# Patient Record
Sex: Female | Born: 2000
Health system: Southern US, Community
[De-identification: ages and names within clinical notes are randomized; demographics above are authoritative.]

## PROBLEM LIST (undated history)

## (undated) ENCOUNTER — Ambulatory Visit: Discharge status: Absent without leave | Payer: Self-pay

## (undated) DIAGNOSIS — N390 Urinary tract infection, site not specified: Secondary | ICD-10-CM

## (undated) DIAGNOSIS — Z975 Presence of (intrauterine) contraceptive device: Secondary | ICD-10-CM

## (undated) DIAGNOSIS — A549 Gonococcal infection, unspecified: Secondary | ICD-10-CM

## (undated) HISTORY — DX: Urinary tract infection, site not specified: N39.0

## (undated) HISTORY — DX: Gonococcal infection, unspecified: A54.9

## (undated) HISTORY — DX: Presence of (intrauterine) contraceptive device: Z97.5

---
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
## 2019-05-07 ENCOUNTER — Other Ambulatory Visit (HOSPITAL_COMMUNITY)
Admission: RE | Admit: 2019-05-07 | Discharge: 2019-05-07 | Disposition: A | Discharge status: Home / Self Care | Payer: No Typology Code available for payment source | Source: Ambulatory Visit | Attending: Family Medicine | Admitting: Family Medicine

## 2019-05-07 ENCOUNTER — Other Ambulatory Visit: Payer: Self-pay

## 2019-05-07 ENCOUNTER — Encounter: Payer: Self-pay | Admitting: Family Medicine

## 2019-05-07 ENCOUNTER — Ambulatory Visit (INDEPENDENT_AMBULATORY_CARE_PROVIDER_SITE_OTHER): Payer: No Typology Code available for payment source | Admitting: Family Medicine

## 2019-05-07 VITALS — BP 112/68 | HR 78 | Temp 99.1°F | Resp 15 | Ht 67.0 in | Wt 172.0 lb

## 2019-05-07 DIAGNOSIS — R5383 Other fatigue: Secondary | ICD-10-CM | POA: Diagnosis not present

## 2019-05-07 DIAGNOSIS — Z23 Encounter for immunization: Secondary | ICD-10-CM

## 2019-05-07 DIAGNOSIS — N3001 Acute cystitis with hematuria: Secondary | ICD-10-CM

## 2019-05-07 DIAGNOSIS — Z833 Family history of diabetes mellitus: Secondary | ICD-10-CM

## 2019-05-07 DIAGNOSIS — R300 Dysuria: Secondary | ICD-10-CM | POA: Insufficient documentation

## 2019-05-07 LAB — POCT URINALYSIS DIP (CLINITEK)
Bilirubin, UA: NEGATIVE
Glucose, UA: NEGATIVE mg/dL
Ketones, POC UA: NEGATIVE mg/dL
Nitrite, UA: NEGATIVE
POC PROTEIN,UA: NEGATIVE
Spec Grav, UA: 1.025 (ref 1.010–1.025)
Urobilinogen, UA: 0.2 E.U./dL
pH, UA: 7 (ref 5.0–8.0)

## 2019-05-07 MED ORDER — NITROFURANTOIN MONOHYD MACRO 100 MG PO CAPS: 100.0000 mg | ORAL_CAPSULE | Freq: Two times a day (BID) | ORAL | 0 refills | Status: AC

## 2019-05-07 NOTE — Progress Notes (Signed)
Subjective:  Patient ID: Michelle Hunter, female    DOB: 10-30-2000  Age: 18 y.o. MRN: [id number]  CC:  Chief Complaint  Patient presents with  . New Patient (Initial Visit)    establish care  . burning with urination    x 1 week  . foul odor to urine     HPI  HPI Michelle Hunter is a 18 year old female patient who is here to establish care.  Previously seen at her pediatrician's office.  Reports that she has had some excessive feelings of tiredness thought it was because she started working third shift several months ago at [employer] but reports that she is not feeling any better.  Mother is noticing that she just seems to be very lethargic when even if she slept.  Was concern for possible low iron as she has had anemia when she was younger and her mother also had anemia.  Has family history that includes diabetes .  Would like to get flu shot today.  Reports that she feels like she may have a urinary tract infection also.  A week ago she noticed that she was having some increased sensation of urination need and frequency along with burning with voiding.  Denies having any risk of sexually transmitted diseases but would like to be tested for that to rule out in case is not an infection.  Otherwise doing well and has limited history given age.  Not currently taking any medications at this time.  Denies wanting any birth control currently.  Today patient denies signs and symptoms of COVID 19 infection including fever, chills, cough, shortness of breath, and headache. Past Medical, Surgical, Social History, Allergies, and Medications have been Reviewed.   Past Medical History:  Diagnosis Date  . Gonorrhea 2015  . Nexplanon in place   . Urinary tract infection     No outpatient medications have been marked as taking for the 05/07/19 encounter (Office Visit) with Freddy Finner, NP.    ROS:  Review of Systems  Constitutional: Negative.   HENT: Negative.   Eyes:  Negative.   Respiratory: Negative.   Cardiovascular: Negative.   Gastrointestinal: Negative.   Genitourinary: Positive for dysuria.  Musculoskeletal: Negative.   Skin: Negative.   Neurological: Negative.   Endo/Heme/Allergies: Negative.   Psychiatric/Behavioral: Negative.      Objective:   Today's Vitals: BP 112/68   Pulse 78   Temp 99.1 F (37.3 C) (Oral)   Resp 15   Ht 5\' 7"  (1.702 m)   Wt 172 lb 0.6 oz (78 kg)   SpO2 94%   BMI 26.95 kg/m  Vitals with BMI 05/07/2019 06/05/2018 06/19/2017  Height 5\' 7"  5\' 9"  5\' 7"   Weight 172 lbs 1 oz 133 lbs 6 oz 133 lbs  BMI 26.94 19.69 20.83  Systolic 112 105 90  Diastolic 68 70 64  Pulse 78 98 82     Physical Exam Vitals and nursing note reviewed.  Constitutional:      Appearance: Normal appearance. She is well-developed, well-groomed and overweight.  HENT:     Head: Normocephalic and atraumatic.     Right Ear: External ear normal.     Left Ear: External ear normal.     Nose: Nose normal.     Mouth/Throat:     Mouth: Mucous membranes are moist.     Pharynx: Oropharynx is clear.  Eyes:     General:        Right eye: No  discharge.        Left eye: No discharge.     Conjunctiva/sclera: Conjunctivae normal.  Cardiovascular:     Rate and Rhythm: Normal rate and regular rhythm.     Pulses: Normal pulses.     Heart sounds: Normal heart sounds.  Pulmonary:     Effort: Pulmonary effort is normal.     Breath sounds: Normal breath sounds.  Musculoskeletal:        General: Normal range of motion.     Cervical back: Normal range of motion and neck supple.  Skin:    General: Skin is warm.  Neurological:     General: No focal deficit present.     Mental Status: She is alert and oriented to person, place, and time.  Psychiatric:        Attention and Perception: Attention normal.        Mood and Affect: Mood normal.        Speech: Speech normal.        Behavior: Behavior normal. Behavior is cooperative.        Thought Content:  Thought content normal.        Cognition and Memory: Cognition normal.        Judgment: Judgment normal.    Assessment   1. Acute cystitis with hematuria   2. Fatigue, unspecified type   3. Family history of diabetes mellitus   4. Need for immunization against influenza     Tests ordered Orders Placed This Encounter  Procedures  . Urine Culture  . Flu Vaccine QUAD 36+ mos IM  . CBC with Differential/Platelet  . BASIC METABOLIC PANEL WITH GFR  . Hemoglobin A1c  . TSH  . VITAMIN D 25 Hydroxy  . POCT URINALYSIS DIP (CLINITEK)    Plan: Please see assessment and plan per problem list above.   Meds ordered this encounter  Medications  . nitrofurantoin, macrocrystal-monohydrate, (MACROBID) 100 MG capsule    Sig: Take 1 capsule (100 mg total) by mouth 2 (two) times daily for 5 days.    Dispense:  10 capsule    Refill:  0    Order Specific Question:   Supervising Provider    Answer:   Fayrene Helper [[id number]]    Patient to follow-up in 6 months   Perlie Mayo, NP

## 2019-05-07 NOTE — Patient Instructions (Addendum)
  I appreciate the opportunity to provide you with care for your health and wellness. Today we discussed: overall health   Follow up: 6 months for annual no pap  Labs today.  Nice to meet you today :)  Please start taking Macrobid as directed on the bottle. Drink water with each dose.  We will update you on labs once we have them back.  I hope you have a wonderful, happy, safe, and healthy Holiday Season! See you in the New Year :)  Please continue to practice social distancing to keep you, your family, and our community safe.  If you must go out, please wear a mask and practice good handwashing.  It was a pleasure to see you and I look forward to continuing to work together on your health and well-being. Please do not hesitate to call the office if you need care or have questions about your care.  Have a wonderful day and week. With Gratitude, Cherly Beach, DNP, AGNP-BC

## 2019-05-08 ENCOUNTER — Other Ambulatory Visit (HOSPITAL_COMMUNITY)
Admission: RE | Admit: 2019-05-08 | Discharge: 2019-05-08 | Disposition: A | Discharge status: Home / Self Care | Payer: Self-pay | Source: Ambulatory Visit | Attending: Family Medicine | Admitting: Family Medicine

## 2019-05-08 ENCOUNTER — Other Ambulatory Visit: Payer: Self-pay | Admitting: Family Medicine

## 2019-05-08 DIAGNOSIS — E559 Vitamin D deficiency, unspecified: Secondary | ICD-10-CM

## 2019-05-08 DIAGNOSIS — R8290 Unspecified abnormal findings in urine: Secondary | ICD-10-CM | POA: Insufficient documentation

## 2019-05-08 DIAGNOSIS — R300 Dysuria: Secondary | ICD-10-CM | POA: Insufficient documentation

## 2019-05-08 LAB — BASIC METABOLIC PANEL WITH GFR
BUN: 11 mg/dL (ref 7–20)
CO2: 25 mmol/L (ref 20–32)
Calcium: 9 mg/dL (ref 8.9–10.4)
Chloride: 106 mmol/L (ref 98–110)
Creat: 0.78 mg/dL (ref 0.50–1.00)
GFR, Est African American: 128 mL/min/{1.73_m2} (ref >=60)
GFR, Est Non African American: 110 mL/min/{1.73_m2} (ref >=60)
Glucose, Bld: 88 mg/dL (ref 65–139)
Potassium: 4 mmol/L (ref 3.8–5.1)
Sodium: 139 mmol/L (ref 135–146)

## 2019-05-08 LAB — CBC WITH DIFFERENTIAL/PLATELET
Absolute Monocytes: 600 cells/uL (ref 200–950)
Basophils Absolute: 28 cells/uL (ref 0–200)
Basophils Relative: 0.5 %
Eosinophils Absolute: 61 cells/uL (ref 15–500)
Eosinophils Relative: 1.1 %
HCT: 37.9 % (ref 35.0–45.0)
Hemoglobin: 12.5 g/dL (ref 11.7–15.5)
Lymphs Abs: 2140 cells/uL (ref 850–3900)
MCH: 27.7 pg (ref 27.0–33.0)
MCHC: 33 g/dL (ref 32.0–36.0)
MCV: 84 fL (ref 80.0–100.0)
MPV: 11.4 fL (ref 7.5–12.5)
Monocytes Relative: 10.9 %
Neutro Abs: 2673 cells/uL (ref 1500–7800)
Neutrophils Relative %: 48.6 %
Platelets: 208 10*3/uL (ref 140–400)
RBC: 4.51 10*6/uL (ref 3.80–5.10)
RDW: 12.4 % (ref 11.0–15.0)
Total Lymphocyte: 38.9 %
WBC: 5.5 10*3/uL (ref 3.8–10.8)

## 2019-05-08 LAB — HEMOGLOBIN A1C
Hgb A1c MFr Bld: 5.5 % of total Hgb (ref <5.7)
Mean Plasma Glucose: 111 (calc)
eAG (mmol/L): 6.2 (calc)

## 2019-05-08 LAB — TSH: TSH: 1.59 mIU/L

## 2019-05-08 LAB — VITAMIN D 25 HYDROXY (VIT D DEFICIENCY, FRACTURES): Vit D, 25-Hydroxy: 9 ng/mL — ABNORMAL LOW (ref 30–100)

## 2019-05-08 MED ORDER — VITAMIN D (ERGOCALCIFEROL) 1.25 MG (50000 UNIT) PO CAPS: 50000.0000 [IU] | ORAL_CAPSULE | ORAL | 1 refills | Status: DC

## 2019-05-10 LAB — URINE CULTURE: Culture: >=100000 — AB

## 2019-05-11 ENCOUNTER — Encounter: Payer: Self-pay | Admitting: Family Medicine

## 2019-05-11 DIAGNOSIS — Z23 Encounter for immunization: Secondary | ICD-10-CM

## 2019-05-11 DIAGNOSIS — Z833 Family history of diabetes mellitus: Secondary | ICD-10-CM | POA: Insufficient documentation

## 2019-05-11 DIAGNOSIS — R5383 Other fatigue: Secondary | ICD-10-CM | POA: Insufficient documentation

## 2019-05-11 HISTORY — DX: Encounter for immunization: Z23

## 2019-05-11 NOTE — Assessment & Plan Note (Signed)
Family Hx-given excessive fatigue- will check A1c

## 2019-05-11 NOTE — Assessment & Plan Note (Signed)
Checking labs ans S&S of vitamin def. Vs night shift sleep trouble.

## 2019-05-11 NOTE — Assessment & Plan Note (Signed)
+   dip in office, sent for cx tx with macrobid Encouraged safe sex practices.

## 2019-05-11 NOTE — Assessment & Plan Note (Signed)

## 2019-05-21 LAB — URINE CYTOLOGY ANCILLARY ONLY
Bacterial Vaginitis-Urine: POSITIVE — AB
Candida Urine: NEGATIVE
Chlamydia: NEGATIVE
Comment: NEGATIVE
Comment: NORMAL
Neisseria Gonorrhea: NEGATIVE

## 2019-06-02 ENCOUNTER — Other Ambulatory Visit: Payer: Self-pay | Admitting: Family Medicine

## 2019-06-02 DIAGNOSIS — B9689 Other specified bacterial agents as the cause of diseases classified elsewhere: Secondary | ICD-10-CM

## 2019-06-02 MED ORDER — METRONIDAZOLE 500 MG PO TABS: 500.0000 mg | ORAL_TABLET | Freq: Two times a day (BID) | ORAL | 0 refills | Status: AC

## 2019-11-06 ENCOUNTER — Ambulatory Visit (INDEPENDENT_AMBULATORY_CARE_PROVIDER_SITE_OTHER): Payer: Self-pay | Admitting: Family Medicine

## 2019-11-06 ENCOUNTER — Other Ambulatory Visit: Payer: Self-pay

## 2019-11-06 ENCOUNTER — Encounter: Payer: Self-pay | Admitting: Family Medicine

## 2019-11-06 VITALS — BP 118/72 | HR 76 | Temp 97.6°F | Ht 68.0 in | Wt 171.8 lb

## 2019-11-06 DIAGNOSIS — Z3200 Encounter for pregnancy test, result unknown: Secondary | ICD-10-CM

## 2019-11-06 DIAGNOSIS — Z6826 Body mass index (BMI) 26.0-26.9, adult: Secondary | ICD-10-CM

## 2019-11-06 DIAGNOSIS — E663 Overweight: Secondary | ICD-10-CM

## 2019-11-06 DIAGNOSIS — Z0001 Encounter for general adult medical examination with abnormal findings: Secondary | ICD-10-CM | POA: Insufficient documentation

## 2019-11-06 LAB — POCT URINE PREGNANCY: Preg Test, Ur: NEGATIVE

## 2019-11-06 NOTE — Progress Notes (Signed)
  Health Maintenance reviewed -   Immunization History  Administered Date(s) Administered  . DTaP 09/10/1999, 12/22/1999, 02/22/2001, 07/30/2001  . HPV 9-valent 02/02/2015  . HPV Quadrivalent 06/14/2013, 09/15/2014  . Hepatitis A 04/04/2014  . Hepatitis A, Ped/Adol-2 Dose 06/14/2013  . Hepatitis B 06/29/1999, 07/28/1999, 12/22/1999  . HiB (PRP-OMP) 09/10/1999, 12/22/1999, 02/22/2001, 07/30/2001  . IPV 09/10/1999, 12/22/1999, 02/22/2001  . Influenza Nasal 03/04/2008, 04/17/2008  . Influenza Whole 04/12/2010  . Influenza,inj,Quad PF,6+ Mos 05/07/2019  . Influenza-Unspecified 04/04/2014  . MMR 02/22/2001, 12/01/2014  . Meningococcal Conjugate 06/14/2013  . Pneumococcal Conjugate-13 09/10/1999, 09/10/1999, 12/22/1999, 02/22/2001  . Td 04/12/2010  . Tdap 04/12/2010  . Varicella 02/22/2001, 06/14/2013   Last Pap smear: N/A Last mammogram: N/A Last colonoscopy: N/A Last DEXA: N/A Dentist: Sees twice yearly last month Ophtho: Wears glasses but does not wear them all the time updated Exercise: Is not exercising Smoker: Not smoking Alcohol Use: Not using alcohol  Other doctors caring for patient include:  Patient Care Team: Mills, Hannah M, NP as PCP - General (Family Medicine)  End of Life Discussion:  Patient does not have a living will and medical power of attorney  Subjective:   HPI  Michelle Hunter is a 19 y.o. female who presents for annual wellness visit and follow-up on chronic medical conditions.  She has the following concerns: Recently had unprotected sex.  Last cycle was June 9-13.  Is wondering if she is pregnant.  However her next cycle is not due for another week and a half.  She has not had any irregular bleeding, cramps or signs of possible implantation.  She denies having any other issues or concerns today.  Review Of Systems  Review of Systems  Constitutional: Negative.   HENT: Negative.   Eyes: Negative.   Respiratory: Negative.   Cardiovascular:  Negative.   Gastrointestinal: Negative.   Endocrine: Negative.   Genitourinary: Negative.   Musculoskeletal: Negative.   Skin: Negative.   Allergic/Immunologic: Negative.   Neurological: Negative.   Hematological: Negative.   Psychiatric/Behavioral: Negative.   All other systems reviewed and are negative.   Objective:   PHYSICAL EXAM:  BP 118/72 (BP Location: Left Arm, Patient Position: Sitting, Cuff Size: Normal)   Pulse 76   Temp 97.6 F (36.4 C) (Temporal)   Ht 5' 8" (1.727 m)   Wt 171 lb 12.8 oz (77.9 kg)   LMP 10/23/2019 (Exact Date)   SpO2 99%   BMI 26.12 kg/m    Physical Exam Vitals and nursing note reviewed.  Constitutional:      Appearance: Normal appearance. She is well-developed, well-groomed and overweight.  HENT:     Head: Normocephalic.     Right Ear: Hearing, tympanic membrane, ear canal and external ear normal.     Left Ear: Hearing, tympanic membrane, ear canal and external ear normal.     Nose: Nose normal.     Mouth/Throat:     Lips: Pink.     Mouth: Mucous membranes are moist.     Pharynx: Oropharynx is clear. Uvula midline.  Eyes:     General: Lids are normal.     Extraocular Movements: Extraocular movements intact.     Conjunctiva/sclera: Conjunctivae normal.     Pupils: Pupils are equal, round, and reactive to light.  Neck:     Thyroid: No thyroid mass, thyromegaly or thyroid tenderness.  Cardiovascular:     Rate and Rhythm: Normal rate and regular rhythm.     Pulses: Normal pulses.            Radial pulses are 2+ on the right side and 2+ on the left side.       Dorsalis pedis pulses are 2+ on the right side and 2+ on the left side.     Heart sounds: Normal heart sounds.  Pulmonary:     Effort: Pulmonary effort is normal.     Breath sounds: Normal breath sounds and air entry.  Abdominal:     General: Abdomen is flat. Bowel sounds are normal.     Palpations: Abdomen is soft.     Tenderness: There is no abdominal tenderness. There is no  right CVA tenderness or left CVA tenderness.     Hernia: No hernia is present.  Musculoskeletal:        General: Normal range of motion.     Cervical back: Full passive range of motion without pain, normal range of motion and neck supple.     Right lower leg: No edema.     Left lower leg: No edema.     Comments: Moves all extremities, full range of motion intact throughout  Lymphadenopathy:     Cervical: No cervical adenopathy.  Skin:    General: Skin is warm and dry.     Capillary Refill: Capillary refill takes less than 2 seconds.  Neurological:     General: No focal deficit present.     Mental Status: She is alert and oriented to person, place, and time. Mental status is at baseline.     Cranial Nerves: Cranial nerves are intact.     Sensory: Sensation is intact.     Motor: Motor function is intact.     Coordination: Coordination is intact.     Gait: Gait is intact.     Deep Tendon Reflexes: Reflexes are normal and symmetric.  Psychiatric:        Attention and Perception: Attention and perception normal.        Mood and Affect: Mood and affect normal.        Speech: Speech normal.        Behavior: Behavior normal. Behavior is cooperative.        Thought Content: Thought content normal.        Cognition and Memory: Cognition and memory normal.        Judgment: Judgment normal.     Comments: Good communication, good eye contact, pleasant throughout conversation      Depression Screening  Depression screen [facility] 2/9 11/06/2019 05/07/2019 03/09/2017 06/24/2016 03/15/2016  Decreased Interest 0 0 0 0 0  Down, Depressed, Hopeless 0 0 0 0 0  PHQ - 2 Score 0 0 0 0 0  Altered sleeping 0 - 0 0 0  Tired, decreased energy 0 - 0 0 0  Change in appetite 0 - 0 1 0  Feeling bad or failure about yourself  0 - 0 0 0  Trouble concentrating 0 - 0 0 0  Moving slowly or fidgety/restless 0 - 0 0 0  Suicidal thoughts 0 - 0 0 0  PHQ-9 Score 0 - 0 1 0  Difficult doing work/chores Not difficult at  all - - - -     Falls  Fall Risk  11/06/2019 05/07/2019  Falls in the past year? 0 0  Number falls in past yr: 0 0  Injury with Fall? 0 0  Risk for fall due to : No Fall Risks -  Follow up Falls evaluation completed -    Assessment & Plan:   1. Annual visit  for general adult medical examination with abnormal findings   2. Overweight with body mass index (BMI) of 26 to 26.9 in adult   3. Possible pregnancy     Tests ordered Orders Placed This Encounter  Procedures  . POCT urine pregnancy     Plan: Please see assessment and plan per problem list above.   No orders of the defined types were placed in this encounter.   I have personally reviewed: The patient's medical and social history Their use of alcohol, tobacco or illicit drugs Their current medications and supplements The patient's functional ability including ADLs,fall risks, home safety risks, cognitive, and hearing and visual impairment Diet and physical activities Evidence for depression or mood disorders  The patient's weight, height, BMI, and visual acuity have been recorded in the chart.  I have made referrals, counseling, and provided education to the patient based on review of the above and I have provided the patient with a written personalized care plan for preventive services.    Note: This dictation was prepared with Dragon dictation along with smaller phrase technology. Similar sounding words can be transcribed inadequately or may not be corrected upon review. Any transcriptional errors that result from this process are unintentional.      Hannah M Mills, NP   11/06/2019  

## 2019-11-06 NOTE — Assessment & Plan Note (Signed)
Michelle Hunter is re-educated about the importance of exercise daily to help with weight management. A minumum of 30 minutes daily is recommended. Additionally, importance of healthy food choices.  Wt Readings from Last 3 Encounters:  11/06/19 171 lb 12.8 oz (77.9 kg)  05/07/19 172 lb 0.6 oz (78 kg) (92 %, Z= 1.43)*  06/05/18 133 lb 6.4 oz (60.5 kg) (63 %, Z= 0.33)*   * Growth percentiles are based on CDC (Girls, 2-20 Years) data.

## 2019-11-06 NOTE — Patient Instructions (Signed)
I appreciate the opportunity to provide you with care for your health and wellness. Today we discussed: overall health   Follow up: 1 year for annual no pap  No labs or referrals today  I recommend you starting a prenatal vitamin to take daily.  HEALTH MAINTENANCE RECOMMENDATIONS:  It is recommended that you get at least 30 minutes of aerobic exercise at least 5 days/week (for weight loss, you may need as much as 60-90 minutes). This can be any activity that gets your heart rate up. This can be divided in 10-15 minute intervals if needed, but try and build up your endurance at least once a week.  Weight bearing exercise is also recommended twice weekly.  Eat a healthy diet with lots of vegetables, fruits and fiber.  "Colorful" foods have a lot of vitamins (ie green vegetables, tomatoes, red peppers, etc).  Limit sweet tea, regular sodas and alcoholic beverages, all of which has a lot of calories and sugar.  Up to 1 alcoholic drink daily may be beneficial for women (unless trying to lose weight, watch sugars).  Drink a lot of water.  Monthly self breast exams and yearly mammograms for women over the age of 77 is recommended.  Sunscreen of at least SPF 30 should be used on all sun-exposed parts of the skin when outside between the hours of 10 am and 4 pm (not just when at beach or pool, but even with exercise, golf, tennis, and yard work!)  Use a sunscreen that says "broad spectrum" so it covers both UVA and UVB rays, and make sure to reapply every 1-2 hours.  Remember to change the batteries in your smoke detectors when changing your clock times in the spring and fall.  Use your seat belt every time you are in a car, and please drive safely and not be distracted with cell phones and texting while driving.  Please continue to practice social distancing to keep you, your family, and our community safe.  If you must go out, please wear a mask and practice good handwashing.  It was a pleasure to  see you and I look forward to continuing to work together on your health and well-being. Please do not hesitate to call the office if you need care or have questions about your care.  Have a wonderful day and week. With Gratitude, Tereasa Coop, DNP, AGNP-BC

## 2019-11-06 NOTE — Assessment & Plan Note (Signed)
Discussed monthly self breast exams and yearly mammograms; at least 30 minutes of aerobic activity at least 5 days/week and weight-bearing exercise 2x/week; proper sunscreen use reviewed; healthy diet, including goals of calcium and vitamin D intake and alcohol recommendations (less than or equal to 1 drink/day) reviewed; regular seatbelt use; changing batteries in smoke detectors.  Immunization recommendations discussed.    

## 2019-11-06 NOTE — Assessment & Plan Note (Signed)
Negative pregnancy test in office.  Most likely too early for testing.  And educated on signs and symptoms of possible pregnancy educated on the need to start taking a prenatal vitamin.  And advised to call us back if she does test positive for home pregnancy test.

## 2019-11-18 ENCOUNTER — Encounter: Payer: Self-pay | Admitting: Emergency Medicine

## 2019-11-18 ENCOUNTER — Encounter: Payer: Self-pay | Admitting: Family Medicine

## 2019-11-18 ENCOUNTER — Other Ambulatory Visit: Payer: Self-pay

## 2019-11-18 ENCOUNTER — Ambulatory Visit: Payer: Self-pay

## 2019-11-18 ENCOUNTER — Ambulatory Visit
Admission: EM | Admit: 2019-11-18 | Discharge: 2019-11-18 | Disposition: A | Discharge status: Home / Self Care | Payer: Self-pay | Attending: Emergency Medicine | Admitting: Emergency Medicine

## 2019-11-18 DIAGNOSIS — Z3202 Encounter for pregnancy test, result negative: Secondary | ICD-10-CM

## 2019-11-18 LAB — POCT URINE PREGNANCY: Preg Test, Ur: NEGATIVE

## 2019-11-18 NOTE — ED Provider Notes (Signed)
[facility]   [id number] 11/18/19 Arrival Time: 1050  CC: Pregnancy test  SUBJECTIVE:  Michelle Hunter is a 19 y.o. female who presents for pregnancy test.  LMP was 6/9.  Currently not on BC.  Last unprotected sex 3 days ago.  Had faint positive pregnancy test at home. Is not trying to get pregnant.  Complains of some cramping and light spotting.  She denies fever, chills, nausea, vomiting, weight changes, breast tenderness, fatigue, abdominal or pelvic pain, dysuria, increased urinary frequency or urgency.    ROS: As per HPI.  All other pertinent ROS negative.     Past Medical History:  Diagnosis Date  . Gonorrhea 2015  . Need for immunization against influenza 05/11/2019  . Nexplanon in place   . Urinary tract infection    History reviewed. No pertinent surgical history. No Known Allergies No current facility-administered medications on file prior to encounter.   Current Outpatient Medications on File Prior to Encounter  Medication Sig Dispense Refill  . Vitamin D, Ergocalciferol, (DRISDOL) 1.25 MG (50000 UT) CAPS capsule Take 1 capsule (50,000 Units total) by mouth every 7 (seven) days. 12 capsule 1   Social History   Socioeconomic History  . Marital status: Single    Spouse name: Not on file  . Number of children: Not on file  . Years of education: Not on file  . Highest education level: 12th grade  Occupational History    Comment: [employer]   Tobacco Use  . Smoking status: Never Smoker  . Smokeless tobacco: Never Used  Substance and Sexual Activity  . Alcohol use: No  . Drug use: No  . Sexual activity: Yes    Birth control/protection: None, Condom  Other Topics Concern  . Not on file  Social History Narrative   Lives with Mom      Enjoys: music, hanging with friends, eating       Diet: eats all food groups-veggies with each meal, chips-not really big on sweets   Caffeine: no coffee, not energy, no sweet teas, some soda-weekly     Water: 8 cups daily       Wears seat belt    Smoke detectors in home   Does not use phone while driving    Social Determinants of Health   Financial Resource Strain: Low Risk   . Difficulty of Paying Living Expenses: Not hard at all  Food Insecurity: No Food Insecurity  . Worried About Programme researcher, broadcasting/film/video in the Last Year: Never true  . Ran Out of Food in the Last Year: Never true  Transportation Needs: No Transportation Needs  . Lack of Transportation (Medical): No  . Lack of Transportation (Non-Medical): No  Physical Activity: Inactive  . Days of Exercise per Week: 0 days  . Minutes of Exercise per Session: 0 min  Stress: No Stress Concern Present  . Feeling of Stress : Not at all  Social Connections: Socially Isolated  . Frequency of Communication with Friends and Family: More than three times a week  . Frequency of Social Gatherings with Friends and Family: More than three times a week  . Attends Religious Services: Never  . Active Member of Clubs or Organizations: No  . Attends Banker Meetings: Never  . Marital Status: Never married  Intimate Partner Violence: Not At Risk  . Fear of Current or Ex-Partner: No  . Emotionally Abused: No  . Physically Abused: No  . Sexually Abused: No   Family  History  Problem Relation Age of Onset  . Diabetes Paternal Grandfather   . Allergic rhinitis Sister     OBJECTIVE:  Vitals:   11/18/19 1101  BP: 134/81  Pulse: (!) 104  Resp: 17  Temp: 99.1 F (37.3 C)  TempSrc: Oral  SpO2: 97%    General appearance: alert; no distress Eyes: PERRLA; EOMI HENT: normocephalic; atraumatic Neck: supple with FROM Lungs: clear to auscultation bilaterally Heart: regular rate and rhythm.   Extremities: no edema; symmetrical with no gross deformities Skin: warm and dry Neurologic: normal gait Psychological: alert and cooperative; normal mood and affect  LABS:   Results for orders placed or performed during the hospital  encounter of 11/18/19 (from the past 24 hour(s))  POCT urine pregnancy     Status: None   Collection Time: 11/18/19 11:08 AM  Result Value Ref Range   Preg Test, Ur Negative Negative     ASSESSMENT & PLAN:  1. Pregnancy test negative     Urine pregnancy was negative Rest and push fluids Begin taking prenatal vitamins if having unprotected sex Follow up with OB or PCP for further evaluation and management Return or go to the ED if you have any of the following: fever, chills, persistent vomiting, abdominal pain, vaginal bleeding, vaginal pain, vaginal discharge or odor, etc...  Reviewed expectations re: course of current medical issues. Questions answered. Outlined signs and symptoms indicating need for more acute intervention. Patient verbalized understanding. After Visit Summary given.   Rennis Harding, PA-C 11/18/19 1125

## 2019-11-18 NOTE — ED Triage Notes (Signed)
Pt had a positive preg test with a faint line. lmp 06/09. Has had some cramping and light spotting.

## 2019-11-18 NOTE — Discharge Instructions (Addendum)
Urine pregnancy was negative Rest and push fluids Begin taking prenatal vitamins if having unprotected sex Follow up with OB or PCP for further evaluation and management Return or go to the ED if you have any of the following: fever, chills, persistent vomiting, abdominal pain, vaginal bleeding, vaginal pain, vaginal discharge or odor, etc..Marland Kitchen

## 2019-11-19 ENCOUNTER — Telehealth: Payer: Self-pay | Admitting: Family Medicine

## 2019-11-19 NOTE — Telephone Encounter (Signed)
Patient left a voicemail with questions concerning a recent urine test and would like someone to call her back to answer some questions she has when possible. [phone]

## 2019-11-19 NOTE — Telephone Encounter (Signed)
Will you look at lab results pt sent a message this am I called to make an appointment before I call her back will you check this out ?

## 2019-11-20 ENCOUNTER — Ambulatory Visit: Payer: Self-pay | Admitting: Family Medicine

## 2019-11-20 NOTE — Telephone Encounter (Signed)
Patient to come in today.  Looks like she had a positive pregnancy test at home.  Ended up having a negative pregnancy test post bleeding and went to an urgent care.  We will follow-up with her at her appointment today.

## 2019-11-20 NOTE — Telephone Encounter (Signed)
noted 

## 2019-12-16 ENCOUNTER — Ambulatory Visit: Payer: Self-pay | Admitting: Adult Health

## 2019-12-24 ENCOUNTER — Ambulatory Visit: Payer: Self-pay | Admitting: Adult Health

## 2020-01-23 ENCOUNTER — Ambulatory Visit
Admission: EM | Admit: 2020-01-23 | Discharge: 2020-01-23 | Disposition: A | Discharge status: Home / Self Care | Payer: Self-pay | Attending: Emergency Medicine | Admitting: Emergency Medicine

## 2020-01-23 ENCOUNTER — Other Ambulatory Visit: Payer: Self-pay

## 2020-01-23 DIAGNOSIS — R350 Frequency of micturition: Secondary | ICD-10-CM | POA: Insufficient documentation

## 2020-01-23 DIAGNOSIS — Z113 Encounter for screening for infections with a predominantly sexual mode of transmission: Secondary | ICD-10-CM | POA: Insufficient documentation

## 2020-01-23 LAB — POCT URINALYSIS DIP (MANUAL ENTRY)
Blood, UA: NEGATIVE
Glucose, UA: NEGATIVE mg/dL
Leukocytes, UA: NEGATIVE
Nitrite, UA: NEGATIVE
Protein Ur, POC: 100 mg/dL — AB
Spec Grav, UA: >=1.03 — AB (ref 1.010–1.025)
Urobilinogen, UA: 1 E.U./dL
pH, UA: 5.5 (ref 5.0–8.0)

## 2020-01-23 LAB — POCT URINE PREGNANCY: Preg Test, Ur: NEGATIVE

## 2020-01-23 MED ORDER — METRONIDAZOLE 500 MG PO TABS: 500.0000 mg | ORAL_TABLET | Freq: Two times a day (BID) | ORAL | 0 refills | Status: AC

## 2020-01-23 NOTE — ED Provider Notes (Signed)
[facility]    CSN: [id number] Arrival date & time: 01/23/20  1714      History   Chief Complaint No chief complaint on file. Urinary frequency, urgency  HPI Michelle Hunter is a 19 y.o. female presenting today for evaluation of possible UTI.  Patient reports beginning today she began to have urinary frequency and urgency.  Patient reports prior UTI as well as prior BV.  Denies any discharge, vaginal odor, itching or irritation.  Last menstrual cycle 01/10/2020.  Is not on birth control.  Recently had intercourse and would like to be screened for STDs as well.    HPI  Past Medical History:  Diagnosis Date  . Gonorrhea 2015  . Need for immunization against influenza 05/11/2019  . Nexplanon in place   . Urinary tract infection     Patient Active Problem List   Diagnosis Date Noted  . Annual visit for general adult medical examination with abnormal findings 11/06/2019  . Possible pregnancy 11/06/2019  . Overweight with body mass index (BMI) of 26 to 26.9 in adult 11/06/2019  . Fatigue 05/11/2019  . Family history of diabetes mellitus 05/11/2019    No past surgical history on file.  OB History    Gravida  0   Para  0   Term  0   Preterm  0   AB  0   Living  0     SAB  0   TAB  0   Ectopic  0   Multiple  0   Live Births               Home Medications    Prior to Admission medications   Medication Sig Start Date End Date Taking? Authorizing Provider  metroNIDAZOLE (FLAGYL) 500 MG tablet Take 1 tablet (500 mg total) by mouth 2 (two) times daily for 7 days. 01/23/20 01/30/20  Brynja Marker C, PA-C  Vitamin D, Ergocalciferol, (DRISDOL) 1.25 MG (50000 UT) CAPS capsule Take 1 capsule (50,000 Units total) by mouth every 7 (seven) days. 05/08/19   Freddy Finner, NP    Family History Family History  Problem Relation Age of Onset  . Diabetes Paternal Grandfather   . Allergic rhinitis Sister     Social History Social History    Tobacco Use  . Smoking status: Never Smoker  . Smokeless tobacco: Never Used  Substance Use Topics  . Alcohol use: No  . Drug use: No     Allergies   Patient has no known allergies.   Review of Systems Review of Systems  Constitutional: Negative for fever.  Respiratory: Negative for shortness of breath.   Cardiovascular: Negative for chest pain.  Gastrointestinal: Negative for abdominal pain, diarrhea, nausea and vomiting.  Genitourinary: Positive for dysuria, frequency and urgency. Negative for flank pain, genital sores, hematuria, menstrual problem, vaginal bleeding, vaginal discharge and vaginal pain.  Musculoskeletal: Negative for back pain.  Skin: Negative for rash.  Neurological: Negative for dizziness, light-headedness and headaches.     Physical Exam Triage Vital Signs ED Triage Vitals [01/23/20 1754]  Enc Vitals Group     BP 117/76     Pulse Rate 83     Resp 17     Temp 97.9 F (36.6 C)     Temp Source Tympanic     SpO2 98 %     Weight      Height      Head Circumference      Peak Flow  Pain Score      Pain Loc      Pain Edu?      Excl. in GC?    No data found.  Updated Vital Signs BP 117/76 (BP Location: Right Arm)   Pulse 83   Temp 97.9 F (36.6 C) (Tympanic)   Resp 17   SpO2 98%   Visual Acuity Right Eye Distance:   Left Eye Distance:   Bilateral Distance:    Right Eye Near:   Left Eye Near:    Bilateral Near:     Physical Exam Vitals and nursing note reviewed.  Constitutional:      Appearance: She is well-developed.     Comments: No acute distress  HENT:     Head: Normocephalic and atraumatic.     Nose: Nose normal.  Eyes:     Conjunctiva/sclera: Conjunctivae normal.  Cardiovascular:     Rate and Rhythm: Normal rate.  Pulmonary:     Effort: Pulmonary effort is normal. No respiratory distress.  Abdominal:     General: There is no distension.  Musculoskeletal:        General: Normal range of motion.     Cervical  back: Neck supple.  Skin:    General: Skin is warm and dry.  Neurological:     Mental Status: She is alert and oriented to person, place, and time.      UC Treatments / Results  Labs (all labs ordered are listed, but only abnormal results are displayed) Labs Reviewed  POCT URINALYSIS DIP (MANUAL ENTRY) - Abnormal; Notable for the following components:      Result Value   Bilirubin, UA small (*)    Ketones, POC UA small (15) (*)    Spec Grav, UA >=1.030 (*)    Protein Ur, POC =100 (*)    All other components within normal limits  URINE CULTURE  POCT URINE PREGNANCY  CERVICOVAGINAL ANCILLARY ONLY    EKG   Radiology No results found.  Procedures Procedures (including critical care time)  Medications Ordered in UC Medications - No data to display  Initial Impression / Assessment and Plan / UC Course  I have reviewed the triage vital signs and the nursing notes.  Pertinent labs & imaging results that were available during my care of the patient were reviewed by me and considered in my medical decision making (see chart for details).     UA with negative leuks and nitrites, will send for culture to definitively rule out UTI.  Empirically treating for BV.  Metronidazole twice daily x1 week.  Vaginal swab pending for further screening and confirmation.  Continue to monitor,Discussed strict return precautions. Patient verbalized understanding and is agreeable with plan.  Final Clinical Impressions(s) / UC Diagnoses   Final diagnoses:  Urinary frequency  Screen for STD (sexually transmitted disease)     Discharge Instructions     Urine without signs of UTI, we will send this for culture to confirm Vaginal swab pending May begin medicine for bacterial vaginosis - metronidazole -twice daily for 1 week, no alcohol while taking  Follow up if not improving or worsening    ED Prescriptions    Medication Sig Dispense Auth. Provider   metroNIDAZOLE (FLAGYL) 500 MG  tablet Take 1 tablet (500 mg total) by mouth 2 (two) times daily for 7 days. 14 tablet Treavon Castilleja, [place] C, PA-C     PDMP not reviewed this encounter.   Aking Klabunde, [place] C, PA-C 01/24/20 1011

## 2020-01-23 NOTE — Discharge Instructions (Signed)
Urine without signs of UTI, we will send this for culture to confirm Vaginal swab pending May begin medicine for bacterial vaginosis - metronidazole -twice daily for 1 week, no alcohol while taking  Follow up if not improving or worsening

## 2020-01-24 LAB — CERVICOVAGINAL ANCILLARY ONLY
Bacterial Vaginitis (gardnerella): NEGATIVE
Candida Glabrata: NEGATIVE
Candida Vaginitis: NEGATIVE
Chlamydia: NEGATIVE
Comment: NEGATIVE
Comment: NEGATIVE
Comment: NEGATIVE
Comment: NEGATIVE
Comment: NEGATIVE
Comment: NORMAL
Neisseria Gonorrhea: NEGATIVE
Trichomonas: NEGATIVE

## 2020-01-24 LAB — URINE CULTURE: Culture: <10000 — AB

## 2020-03-12 ENCOUNTER — Encounter: Payer: Self-pay | Admitting: Family Medicine

## 2020-03-27 ENCOUNTER — Encounter: Payer: Self-pay | Admitting: Family Medicine

## 2020-03-27 ENCOUNTER — Other Ambulatory Visit: Payer: Self-pay

## 2020-03-27 ENCOUNTER — Other Ambulatory Visit (HOSPITAL_COMMUNITY)
Admission: RE | Admit: 2020-03-27 | Discharge: 2020-03-27 | Disposition: A | Discharge status: Home / Self Care | Payer: Self-pay | Source: Ambulatory Visit | Attending: Family Medicine | Admitting: Family Medicine

## 2020-03-27 ENCOUNTER — Ambulatory Visit (INDEPENDENT_AMBULATORY_CARE_PROVIDER_SITE_OTHER): Payer: Self-pay | Admitting: Family Medicine

## 2020-03-27 VITALS — BP 117/75 | HR 82 | Resp 16 | Ht 68.0 in | Wt 163.0 lb

## 2020-03-27 DIAGNOSIS — Z113 Encounter for screening for infections with a predominantly sexual mode of transmission: Secondary | ICD-10-CM

## 2020-03-27 DIAGNOSIS — Z708 Other sex counseling: Secondary | ICD-10-CM | POA: Insufficient documentation

## 2020-03-27 DIAGNOSIS — R300 Dysuria: Secondary | ICD-10-CM

## 2020-03-27 DIAGNOSIS — R319 Hematuria, unspecified: Secondary | ICD-10-CM | POA: Insufficient documentation

## 2020-03-27 LAB — POCT URINALYSIS DIP (CLINITEK)
Bilirubin, UA: NEGATIVE
Blood, UA: NEGATIVE
Glucose, UA: NEGATIVE mg/dL
Nitrite, UA: NEGATIVE
POC PROTEIN,UA: 100 — AB
Spec Grav, UA: >=1.03 — AB (ref 1.010–1.025)
Urobilinogen, UA: 0.2 E.U./dL
pH, UA: 5.5 (ref 5.0–8.0)

## 2020-03-27 MED ORDER — NITROFURANTOIN MONOHYD MACRO 100 MG PO CAPS: 100.0000 mg | ORAL_CAPSULE | Freq: Two times a day (BID) | ORAL | 0 refills | Status: AC

## 2020-03-27 NOTE — Assessment & Plan Note (Signed)
Screening for STI secondary to unprotected sex and signs and symptoms.  Will treat if needed.  Encouraged safe sex practices.

## 2020-03-27 NOTE — Progress Notes (Signed)
Subjective:  Patient ID: Michelle Hunter, female    DOB: 04/05/2001  Age: 19 y.o. MRN: [id number]  CC:  Chief Complaint  Patient presents with  . burning with urination    started about 2 weeks. has taken otc AZO  . Hematuria      HPI  UTI  Urinary Tract Infection  This is a new problem. The current episode started 1 to 4 weeks ago. The problem occurs every urination. The problem has been unchanged. The quality of the pain is described as burning. The pain is at a severity of 0/10. The patient is experiencing no pain. There has been no fever. She is sexually active. There is no history of pyelonephritis. Associated symptoms include frequency and urgency. Pertinent negatives include no chills, discharge, flank pain, hematuria, hesitancy, nausea, possible pregnancy, sweats or vomiting. She has tried nothing for the symptoms. The treatment provided no relief.  Also reports unprotected sex recently. Would like STI testing.   Today patient denies signs and symptoms of COVID 19 infection including fever, chills, cough, shortness of breath, and headache. Past Medical, Surgical, Social History, Allergies, and Medications have been Reviewed.   Past Medical History:  Diagnosis Date  . Gonorrhea 2015  . Need for immunization against influenza 05/11/2019  . Nexplanon in place   . Urinary tract infection     Current Meds  Medication Sig  . Vitamin D, Ergocalciferol, (DRISDOL) 1.25 MG (50000 UT) CAPS capsule Take 1 capsule (50,000 Units total) by mouth every 7 (seven) days.    ROS:  Review of Systems  Constitutional: Negative for chills.  Gastrointestinal: Negative for nausea and vomiting.  Genitourinary: Positive for frequency and urgency. Negative for flank pain, hematuria and hesitancy.     Objective:   Today's Vitals: BP 117/75   Pulse 82   Resp 16   Ht 5\' 8"  (1.727 m)   Wt 163 lb 0.6 oz (74 kg)   SpO2 100%   BMI 24.79 kg/m  Vitals with BMI 03/27/2020 01/23/2020  11/18/2019  Height 5\' 8"  - -  Weight 163 lbs 1 oz - -  BMI 24.8 - -  Systolic 117 117 01/19/2020  Diastolic 75 76 81  Pulse 82 83 104     Physical Exam Vitals and nursing note reviewed.  Constitutional:      Appearance: Normal appearance. She is well-developed, well-groomed and normal weight.  HENT:     Head: Normocephalic and atraumatic.     Right Ear: External ear normal.     Left Ear: External ear normal.     Mouth/Throat:     Comments: Mask in place Eyes:     General:        Right eye: No discharge.        Left eye: No discharge.     Conjunctiva/sclera: Conjunctivae normal.  Cardiovascular:     Rate and Rhythm: Normal rate and regular rhythm.     Pulses: Normal pulses.     Heart sounds: Normal heart sounds.  Pulmonary:     Effort: Pulmonary effort is normal.     Breath sounds: Normal breath sounds.  Abdominal:     Tenderness: There is no abdominal tenderness. There is no right CVA tenderness or left CVA tenderness.  Musculoskeletal:        General: Normal range of motion.     Cervical back: Normal range of motion and neck supple.  Skin:    General: Skin is warm.  Neurological:  General: No focal deficit present.     Mental Status: She is alert and oriented to person, place, and time.  Psychiatric:        Attention and Perception: Attention normal.        Mood and Affect: Mood normal.        Speech: Speech normal.        Behavior: Behavior normal. Behavior is cooperative.        Thought Content: Thought content normal.        Cognition and Memory: Cognition normal.        Judgment: Judgment normal.          Assessment   1. Encounter for screening examination for sexually transmitted disease   2. Burning with urination     Tests ordered Orders Placed This Encounter  Procedures  . Urine Culture  . POCT URINALYSIS DIP (CLINITEK)     Plan: Please see assessment and plan per problem list above.   Meds ordered this encounter  Medications  .  nitrofurantoin, macrocrystal-monohydrate, (MACROBID) 100 MG capsule    Sig: Take 1 capsule (100 mg total) by mouth 2 (two) times daily for 5 days.    Dispense:  10 capsule    Refill:  0    Order Specific Question:   Supervising Provider    Answer:   Kerri Perches [[id number]]    Patient to follow-up in   Note: This dictation was prepared with Dragon dictation along with smaller phrase technology. Similar sounding words can be transcribed inadequately or may not be corrected upon review. Any transcriptional errors that result from this process are unintentional.      Freddy Finner, NP

## 2020-03-27 NOTE — Assessment & Plan Note (Signed)
Positive dip in office.  Sent out start on Macrobid at this time.  Will change if needed.

## 2020-03-27 NOTE — Patient Instructions (Addendum)
  HAPPY FALL!  I appreciate the opportunity to provide you with care for your health and wellness. Today we discussed: UTI   Follow up: as needed  No labs or referrals today  Please take medication as directed and we will follow up with you on other test ran today.  Please continue to practice social distancing to keep you, your family, and our community safe.  If you must go out, please wear a mask and practice good handwashing.  It was a pleasure to see you and I look forward to continuing to work together on your health and well-being. Please do not hesitate to call the office if you need care or have questions about your care.  Have a wonderful day and week. With Gratitude, Tereasa Coop, DNP, AGNP-BC

## 2020-03-29 LAB — URINE CULTURE: Organism ID, Bacteria: NO GROWTH

## 2020-03-31 ENCOUNTER — Other Ambulatory Visit: Payer: Self-pay | Admitting: Family Medicine

## 2020-03-31 DIAGNOSIS — N760 Acute vaginitis: Secondary | ICD-10-CM

## 2020-03-31 DIAGNOSIS — B9689 Other specified bacterial agents as the cause of diseases classified elsewhere: Secondary | ICD-10-CM

## 2020-03-31 DIAGNOSIS — A599 Trichomoniasis, unspecified: Secondary | ICD-10-CM

## 2020-03-31 LAB — CERVICOVAGINAL ANCILLARY ONLY
Bacterial Vaginitis (gardnerella): POSITIVE — AB
Candida Glabrata: NEGATIVE
Candida Vaginitis: NEGATIVE
Chlamydia: NEGATIVE
Comment: NEGATIVE
Comment: NEGATIVE
Comment: NEGATIVE
Comment: NEGATIVE
Comment: NEGATIVE
Comment: NORMAL
Neisseria Gonorrhea: NEGATIVE
Trichomonas: POSITIVE — AB

## 2020-03-31 MED ORDER — METRONIDAZOLE 500 MG PO TABS: 500.0000 mg | ORAL_TABLET | Freq: Two times a day (BID) | ORAL | 0 refills | Status: AC

## 2020-05-07 ENCOUNTER — Encounter: Payer: Self-pay | Admitting: Family Medicine

## 2020-05-07 ENCOUNTER — Ambulatory Visit (INDEPENDENT_AMBULATORY_CARE_PROVIDER_SITE_OTHER): Payer: Self-pay | Admitting: Family Medicine

## 2020-05-07 ENCOUNTER — Other Ambulatory Visit: Payer: Self-pay

## 2020-05-07 VITALS — BP 108/60 | HR 73 | Temp 98.6°F | Ht 68.0 in | Wt 161.0 lb

## 2020-05-07 DIAGNOSIS — Z708 Other sex counseling: Secondary | ICD-10-CM

## 2020-05-07 NOTE — Assessment & Plan Note (Signed)
She had unprotected sex with the same partner that she thinks gave her trichomonias and is worried that maybe he did not get his treatment.  She currently has no symptoms. Her symptoms went away after her treatment last month.  She is still too soon for further testing and understands this and knows the signs to look out for.  She is advised to call back if anything changes.

## 2020-05-07 NOTE — Patient Instructions (Addendum)
  I appreciate the opportunity to provide you with care for your health and wellness. Today we discussed: possible repeat exposure  Follow up: as needed  No labs or referrals today  Call if you develop symptoms as we discussed  Have a Merry Christmas and Happy New Year!  Please continue to practice social distancing to keep you, your family, and our community safe.  If you must go out, please wear a mask and practice good handwashing.  It was a pleasure to see you and I look forward to continuing to work together on your health and well-being. Please do not hesitate to call the office if you need care or have questions about your care.  Have a wonderful day. With Gratitude, Tereasa Coop, DNP, AGNP-BC

## 2020-05-07 NOTE — Progress Notes (Signed)
Subjective:  Patient ID: Michelle Hunter, female    DOB: 04-06-2001  Age: 19 y.o. MRN: [id number]  CC:  Chief Complaint  Patient presents with   Vaginal Itching    Was recently treated for trich, is no longer having symptoms but wants to check to see if it's gone away.      HPI  HPI  Michelle Hunter is a pleasant 19 year old female who presents today after having unprotected sex last night.  Wanting to know if she could have reinfected herself with trichomonas.  She was treated for trichomonas a month back after presenting with a questionable urinary tract infection in November, had testing as she had unprotected sex at that time demonstrated that she had trichomonas that she was provided with treatment which she reports that she completed in full.  She also notified her partner who reported that he was treated as well.  She had unprotected sex with the same partner and is worried that maybe he did not get his treatment.  She currently has no symptoms. Her symptoms went away after her treatment last month.    She denies having any chills, discharge, flank pain, hematuria, hesitancy, nausea or possible pregnancy at this time, vomiting.  She denies having any changes in urination pattern.  Or bowel habits.  She denies having any abdominal pain.  She has no other issues or concerns.   Today patient denies signs and symptoms of COVID 19 infection including fever, chills, cough, shortness of breath, and headache. Past Medical, Surgical, Social History, Allergies, and Medications have been Reviewed.   Past Medical History:  Diagnosis Date   Gonorrhea 2015   Need for immunization against influenza 05/11/2019   Nexplanon in place    Urinary tract infection     Current Meds  Medication Sig   Vitamin D, Ergocalciferol, (DRISDOL) 1.25 MG (50000 UT) CAPS capsule Take 1 capsule (50,000 Units total) by mouth every 7 (seven) days.    ROS:  Review of Systems  HENT: Negative.    Eyes: Negative.   Respiratory: Negative.   Cardiovascular: Negative.   Gastrointestinal: Negative.   Genitourinary: Negative.   Musculoskeletal: Negative.   Skin: Negative.   Neurological: Negative.   Endo/Heme/Allergies: Negative.   Psychiatric/Behavioral: Negative.      Objective:   Today's Vitals: BP 108/60 (BP Location: Right Arm, Patient Position: Sitting, Cuff Size: Normal)    Pulse 73    Temp 98.6 F (37 C) (Tympanic)    Ht 5\' 8"  (1.727 m)    Wt 161 lb (73 kg)    LMP 04/27/2020    SpO2 98%    BMI 24.48 kg/m  Vitals with BMI 05/07/2020 03/27/2020 01/23/2020  Height 5\' 8"  5\' 8"  -  Weight 161 lbs 163 lbs 1 oz -  BMI 24.49 24.8 -  Systolic 108 117 03/24/2020  Diastolic 60 75 76  Pulse 73 82 83     Physical Exam Vitals and nursing note reviewed.  Constitutional:      Appearance: Normal appearance. She is well-developed, well-groomed and normal weight.  HENT:     Head: Normocephalic and atraumatic.     Right Ear: External ear normal.     Left Ear: External ear normal.     Mouth/Throat:     Comments: Mask in place  Eyes:     General:        Right eye: No discharge.        Left eye: No discharge.  Conjunctiva/sclera: Conjunctivae normal.  Cardiovascular:     Rate and Rhythm: Normal rate and regular rhythm.     Pulses: Normal pulses.     Heart sounds: Normal heart sounds.  Pulmonary:     Effort: Pulmonary effort is normal.     Breath sounds: Normal breath sounds.  Musculoskeletal:        General: Normal range of motion.     Cervical back: Normal range of motion and neck supple.  Skin:    General: Skin is warm.  Neurological:     General: No focal deficit present.     Mental Status: She is alert and oriented to person, place, and time.  Psychiatric:        Attention and Perception: Attention normal.        Mood and Affect: Mood normal.        Speech: Speech normal.        Behavior: Behavior normal. Behavior is cooperative.        Thought Content: Thought content  normal.        Cognition and Memory: Cognition normal.        Judgment: Judgment normal.     Assessment   1. Counseling for sexually transmitted diseases     Tests ordered No orders of the defined types were placed in this encounter.    Plan: Please see assessment and plan per problem list above.   No orders of the defined types were placed in this encounter.   Patient to follow-up in as needed    Freddy Finner, NP

## 2020-05-14 ENCOUNTER — Emergency Department (HOSPITAL_COMMUNITY)
Admission: EM | Admit: 2020-05-14 | Discharge: 2020-05-14 | Disposition: A | Discharge status: Home / Self Care | Payer: HRSA Program | Attending: Emergency Medicine | Admitting: Emergency Medicine

## 2020-05-14 ENCOUNTER — Other Ambulatory Visit: Payer: Self-pay

## 2020-05-14 ENCOUNTER — Encounter (HOSPITAL_COMMUNITY): Payer: Self-pay

## 2020-05-14 DIAGNOSIS — R509 Fever, unspecified: Secondary | ICD-10-CM | POA: Diagnosis present

## 2020-05-14 DIAGNOSIS — U071 COVID-19: Secondary | ICD-10-CM | POA: Diagnosis not present

## 2020-05-14 LAB — RESP PANEL BY RT-PCR (FLU A&B, COVID) ARPGX2
Influenza A by PCR: NEGATIVE
Influenza B by PCR: NEGATIVE
SARS Coronavirus 2 by RT PCR: POSITIVE — AB

## 2020-05-14 MED ORDER — ACETAMINOPHEN 325 MG PO TABS
650.0000 mg | ORAL_TABLET | Freq: Once | ORAL | Status: AC
  Administered 2020-05-14: 650 mg via ORAL
  Filled 2020-05-14: qty 2

## 2020-05-14 NOTE — ED Triage Notes (Signed)
Pt presents to ED with complaints of generalized body aches, chills and headache since 0200 this am.

## 2020-05-14 NOTE — ED Notes (Signed)
Date and time results received: 05/14/20 1332 (use smartphrase ".now" to insert current time)  Test: covid Critical Value: positive  Name of Provider Notified: Harlene Salts, PA   Orders Received? Or Actions Taken?

## 2020-05-14 NOTE — Discharge Instructions (Signed)
At this time there does not appear to be the presence of an emergent medical condition, however there is always the potential for conditions to change. Please read and follow the below instructions.  Please return to the Emergency Department immediately for any new or worsening symptoms. Please be sure to follow up with your Primary Care Provider within one week regarding your visit today; please call their office to schedule an appointment even if you are feeling better for a follow-up visit. You have you have been referred to the monoclonal antibody infusion center.  They should call you in the next 1-2 days to schedule you an appointment if you qualify.  Please be sure to answer your phone as they will call from an unrecognized number. Please drink plenty of water to avoid dehydration and get plenty of rest.  Please wear your mask and social distance to avoid spread of COVID-19 to others.  You may use over-the-counter anti-inflammatory such as Tylenol as directed on the packaging to help with fever and body aches.  Go to the nearest Emergency Department immediately if: You have trouble breathing. You have pain or pressure in your chest. You have confusion. You have bluish lips and fingernails. You have difficulty waking from sleep. You have any new/concerning or worsening of symptoms  Please read the additional information packets attached to your discharge summary.  Do not take your medicine if  develop an itchy rash, swelling in your mouth or lips, or difficulty breathing; call 911 and seek immediate emergency medical attention if this occurs.  You may review your lab tests and imaging results in their entirety on your MyChart account.  Please discuss all results of fully with your primary care provider and other specialist at your follow-up visit.  Note: Portions of this text may have been transcribed using voice recognition software. Every effort was made to ensure accuracy; however,  inadvertent computerized transcription errors may still be present.

## 2020-05-14 NOTE — ED Provider Notes (Signed)
[facility] EMERGENCY DEPARTMENT Provider Note   CSN: [id number] Arrival date & time: 05/14/20  9379     History Chief Complaint  Patient presents with  . Generalized Body Aches    Michelle Hunter is a 18 y.o. female reports herself as otherwise healthy no daily medication use.  Patient reports waking up around 2 AM this morning with fever/chills, body aches, headache, rhinorrhea.  She reports symptoms have been constant since onset she has not attempted any medications prior to arrival.  She describes an aching sensation of all of her joints and muscles constant nonradiating no clear aggravating or alleviating factors, moderate in intensity.  She describes a mild generalized headache without aggravating alleviating factors.  She reports feeling warm and cold at the same time but has not measured her temperature.  Denies vision changes, neck stiffness, sore throat, cough/biopsies, shortness of breath, abdominal pain, vomiting, diarrhea, dysuria/hematuria, extremity swelling/color change or any additional concerns  Patient denies any sick contacts but reports she was around a lot of family members over Christmas.  She is unvaccinated.  Of note patient denies chance of pregnancy today.  Patient states understanding that medications given or prescribed today may result in harm to of a pregnancy and she accepts these risks and still chooses not to be pregnancy tested and proceed with medications. HPI     Past Medical History:  Diagnosis Date  . Gonorrhea 2015  . Need for immunization against influenza 05/11/2019  . Nexplanon in place   . Urinary tract infection     Patient Active Problem List   Diagnosis Date Noted  . Counseling for sexually transmitted diseases 03/27/2020  . Burning with urination 03/27/2020  . Annual visit for general adult medical examination with abnormal findings 11/06/2019  . Overweight with body mass index (BMI) of 26 to 26.9 in adult 11/06/2019  .  Fatigue 05/11/2019  . Family history of diabetes mellitus 05/11/2019    History reviewed. No pertinent surgical history.   OB History    Gravida  0   Para  0   Term  0   Preterm  0   AB  0   Living  0     SAB  0   IAB  0   Ectopic  0   Multiple  0   Live Births              Family History  Problem Relation Age of Onset  . Diabetes Paternal Grandfather   . Allergic rhinitis Sister     Social History   Tobacco Use  . Smoking status: Never Smoker  . Smokeless tobacco: Never Used  Substance Use Topics  . Alcohol use: No  . Drug use: No    Home Medications Prior to Admission medications   Medication Sig Start Date End Date Taking? Authorizing Provider  Vitamin D, Ergocalciferol, (DRISDOL) 1.25 MG (50000 UT) CAPS capsule Take 1 capsule (50,000 Units total) by mouth every 7 (seven) days. 05/08/19   Freddy Finner, NP    Allergies    Patient has no known allergies.  Review of Systems   Review of Systems  Constitutional: Positive for chills and fever.  HENT: Positive for rhinorrhea. Negative for sore throat, trouble swallowing and voice change.   Eyes: Negative.  Negative for visual disturbance.  Respiratory: Negative.  Negative for cough and shortness of breath.   Cardiovascular: Negative.  Negative for chest pain.  Gastrointestinal: Negative.  Negative for abdominal pain, diarrhea and  vomiting.  Musculoskeletal: Positive for arthralgias and myalgias. Negative for neck stiffness.  Neurological: Positive for headaches. Negative for weakness and numbness.    Physical Exam Updated Vital Signs BP 111/71 (BP Location: Right Arm)   Pulse (!) 117   Temp 99 F (37.2 C) (Oral)   Resp 20   Ht 5\' 8"  (1.727 m)   Wt 73.5 kg   LMP 04/27/2020   SpO2 100%   BMI 24.63 kg/m   Physical Exam Constitutional:      General: She is not in acute distress.    Appearance: Normal appearance. She is well-developed. She is not ill-appearing or diaphoretic.  HENT:      Head: Normocephalic and atraumatic.     Jaw: There is normal jaw occlusion.     Right Ear: External ear normal.     Left Ear: External ear normal.     Nose: Rhinorrhea present. Rhinorrhea is clear.     Right Sinus: No maxillary sinus tenderness or frontal sinus tenderness.     Left Sinus: No maxillary sinus tenderness or frontal sinus tenderness.     Mouth/Throat:     Mouth: Mucous membranes are moist.     Pharynx: Oropharynx is clear.     Comments: Mild postnasal drip and posterior pharynx cobblestoning.   The patient has normal phonation and is in control of secretions. No stridor.  Midline uvula without edema. Soft palate rises symmetrically. No tonsillar erythema, swelling or exudates. Tongue protrusion is normal, floor of mouth is soft. No trismus. No creptius on neck palpation. No gingival erythema or fluctuance noted. Mucus membranes moist. No pallor noted.  Eyes:     General: Vision grossly intact. Gaze aligned appropriately.     Extraocular Movements: Extraocular movements intact.     Conjunctiva/sclera: Conjunctivae normal.     Pupils: Pupils are equal, round, and reactive to light.  Neck:     Trachea: Trachea and phonation normal. No tracheal tenderness or tracheal deviation.     Meningeal: Brudzinski's sign absent.  Cardiovascular:     Rate and Rhythm: Normal rate and regular rhythm.     Pulses: Normal pulses.     Heart sounds: Normal heart sounds.  Pulmonary:     Effort: Pulmonary effort is normal. No accessory muscle usage or respiratory distress.     Breath sounds: Normal breath sounds and air entry.  Abdominal:     General: There is no distension.     Palpations: Abdomen is soft.     Tenderness: There is no abdominal tenderness. There is no guarding or rebound.  Musculoskeletal:        General: Normal range of motion.     Cervical back: Normal range of motion and neck supple. No edema or rigidity.     Right lower leg: No edema.     Left lower leg: No edema.   Skin:    General: Skin is warm and dry.  Neurological:     Mental Status: She is alert.     GCS: GCS eye subscore is 4. GCS verbal subscore is 5. GCS motor subscore is 6.     Comments: Speech is clear and goal oriented, follows commands Major Cranial nerves without deficit, no facial droop Normal strength in upper and lower extremities bilaterally including dorsiflexion and plantar flexion, strong and equal grip strength Sensation normal to light and sharp touch Moves extremities without ataxia, coordination intact Normal finger to nose and rapid alternating movements Neg romberg, no pronator drift Normal gait  Psychiatric:        Behavior: Behavior normal.    ED Results / Procedures / Treatments   Labs (all labs ordered are listed, but only abnormal results are displayed) Labs Reviewed  RESP PANEL BY RT-PCR (FLU A&B, COVID) ARPGX2 - Abnormal; Notable for the following components:      Result Value   SARS Coronavirus 2 by RT PCR POSITIVE (*)    All other components within normal limits    EKG None  Radiology No results found.  Procedures Procedures (including critical care time)  Medications Ordered in ED Medications  acetaminophen (TYLENOL) tablet 650 mg (650 mg Oral Given 05/14/20 1151)    ED Course  I have reviewed the triage vital signs and the nursing notes.  Pertinent labs & imaging results that were available during my care of the patient were reviewed by me and considered in my medical decision making (see chart for details).    MDM Rules/Calculators/A&P                         Additional history obtained from: 1. Nursing notes from this visit.  Michelle Hunter was evaluated in Emergency Department on 05/14/2020 for the symptoms described in the history of present illness. She was evaluated in the context of the global COVID-19 pandemic, which necessitated consideration that the patient might be at risk for infection with the SARS-CoV-2 virus that  causes COVID-19. Institutional protocols and algorithms that pertain to the evaluation of patients at risk for COVID-19 are in a state of rapid change based on information released by regulatory bodies including the CDC and federal and state organizations. These policies and algorithms were followed during the patient's care in the ED. - 19 year old unvaccinated female presented with a history of rhinorrhea, body aches, headache, fever/chills onset this morning.  On exam she is well-appearing no acute distress.  Cranial nerves intact, no meningeal signs, neuro examination within normal limits.  Cardiopulmonary exam unremarkable.  Airway clear no evidence of PTA, RPA, Ludewig's, sinusitis or other deep space infections of the head or neck.  Abdomen soft nontender without peritoneal signs.  Neurovascular intact to all four extremities no evidence of DVT.  Patient has no vomiting diarrhea or decreased urinary output to suggest dehydration.  She is mildly tachycardic on triage likely secondary to viral illness, she has not taken any anti-inflammatories prior to arrival, will give Tylenol.  Covid test ordered in triage is in process.  Suspicion for COVID-19 viral infection given history and examination - Covid test positive.  Accounts for patient's symptoms.  She is nontoxic on exam, doubt sepsis.  No indication for antibiotics or imaging at this time.  With reassuring cardiopulmonary exam and onset of symptoms today doubt bacterial pneumonia or other emergent pathologies.  Patient encouraged to maintain water intake to avoid dehydration, get plenty of rest, social distance.  I have referred patient to monoclonal antibody infusion center, patient will answer her phone when they call to see if she qualifies for infusion.  Patient encouraged to call her PCP today to inform them of her positive results and need for follow-up appointment. - Patient reevaluated she is resting comfortably bed no acute distress using her  phone she is requesting discharge.  She reports feeling better after Tylenol.  She states understanding of plan of care and has no questions or concerns.  At this time there does not appear to be any evidence of an acute emergency medical condition  and the patient appears stable for discharge with appropriate outpatient follow up. Diagnosis was discussed with patient who verbalizes understanding of care plan and is agreeable to discharge. I have discussed return precautions with patient who verbalizes understanding. Patient encouraged to follow-up with their PCP. All questions answered.  Michelle Hunter was evaluated in Emergency Department on 05/14/2020 for the symptoms described in the history of present illness. She was evaluated in the context of the global COVID-19 pandemic, which necessitated consideration that the patient might be at risk for infection with the SARS-CoV-2 virus that causes COVID-19. Institutional protocols and algorithms that pertain to the evaluation of patients at risk for COVID-19 are in a state of rapid change based on information released by regulatory bodies including the CDC and federal and state organizations. These policies and algorithms were followed during the patient's care in the ED.   Note: Portions of this report may have been transcribed using voice recognition software. Every effort was made to ensure accuracy; however, inadvertent computerized transcription errors may still be present. Final Clinical Impression(s) / ED Diagnoses Final diagnoses:  COVID-19 virus infection    Rx / DC Orders ED Discharge Orders    None       Elizabeth Palau 05/14/20 1427    Mancel Bale, MD 05/15/20 [phone]

## 2020-06-18 ENCOUNTER — Ambulatory Visit: Payer: Self-pay | Admitting: Internal Medicine

## 2020-07-09 ENCOUNTER — Encounter: Payer: Self-pay | Admitting: Internal Medicine

## 2020-07-09 ENCOUNTER — Ambulatory Visit (INDEPENDENT_AMBULATORY_CARE_PROVIDER_SITE_OTHER): Payer: Self-pay | Admitting: Internal Medicine

## 2020-07-09 ENCOUNTER — Other Ambulatory Visit: Payer: Self-pay

## 2020-07-09 VITALS — Ht 62.0 in | Wt 155.0 lb

## 2020-07-09 DIAGNOSIS — N760 Acute vaginitis: Secondary | ICD-10-CM

## 2020-07-09 DIAGNOSIS — B9689 Other specified bacterial agents as the cause of diseases classified elsewhere: Secondary | ICD-10-CM

## 2020-07-09 MED ORDER — METRONIDAZOLE 500 MG PO TABS: 500.0000 mg | ORAL_TABLET | Freq: Two times a day (BID) | ORAL | 0 refills | Status: DC

## 2020-07-09 MED ORDER — FLUCONAZOLE 150 MG PO TABS: 150.0000 mg | ORAL_TABLET | Freq: Once | ORAL | 0 refills | Status: AC

## 2020-07-09 NOTE — Patient Instructions (Signed)
Vaginitis  Vaginitis is irritation and swelling of the vagina. Treatment will depend on the cause. What are the causes? It can be caused by:  Bacteria.  Yeast.  A parasite.  A virus.  Low hormone levels.  Bubble baths, scented tampons, and feminine sprays. Other things can change the balance of the yeast and bacteria that live in the vagina. These include:  Antibiotic medicines.  Not being clean enough.  Some birth control methods.  Sex.  Infection.  Diabetes.  A weakened body defense system (immune system). What increases the risk?  Smoking or being around someone who smokes.  Using washes (douches), scented tampons, or scented pads.  Wearing tight pants or thong underwear.  Using birth control pills or an IUD.  Having sex without a condom or having a lot of partners.  Having an STI.  Using a certain product to kill sperm (nonoxynol-9).  Eating foods that are high in sugar.  Having diabetes.  Having low levels of a female hormone.  Having a weakened body defense system.  Being pregnant or breastfeeding. What are the signs or symptoms?  Fluid coming from the vagina that is not normal.  A bad smell.  Itching, pain, or swelling.  Pain with sex.  Pain or burning when you pee (urinate). Sometimes there are no symptoms. How is this treated? Treatment may include:  Antibiotic creams or pills.  Antifungal medicines.  Medicines to ease symptoms if you have a virus. Your sex partner should also be treated.  Estrogen medicines.  Avoiding scented soaps, sprays, or douches.  Stopping use of products that caused irritation and then using a cream to treat symptoms. Follow these instructions at home: Lifestyle  Keep the area around your vagina clean and dry. ? Avoid using soap. ? Rinse the area with water.  Until your doctor says it is okay: ? Do not use washes for the vagina. ? Do not use tampons. ? Do not have sex.  Wipe from front to  back after going to the bathroom.  When your doctor says it is okay, practice safe sex and use condoms. General instructions  Take over-the-counter and prescription medicines only as told by your doctor.  If you were prescribed an antibiotic medicine, take or use it as told by your doctor. Do not stop taking or using it even if you start to feel better.  Keep all follow-up visits. How is this prevented?  Do not use things that can irritate the vagina, such as fabric softeners. Avoid these products if they are scented: ? Sprays. ? Detergents. ? Tampons. ? Products for cleaning the vagina. ? Soaps or bubble baths.  Let air reach your vagina. To do this: ? Wear cotton underwear. ? Do not wear:  Underwear while you sleep.  Tight pants.  Thong underwear.  Underwear or nylons without a cotton panel. ? Take off any wet clothing, such as bathing suits, as soon as you can. ? Practice safe sex and use condoms. Contact a doctor if:  You have pain in your belly or in the area between your hips.  You have a fever or chills.  Your symptoms last for more than 2-3 days. Get help right away if:  You have a fever and your symptoms get worse all of a sudden. Summary  Vaginitis is irritation and swelling of the vagina.  Treatment will depend on the cause of the condition.  Do not use washes or tampons or have sex until your doctor says it   is okay. This information is not intended to replace advice given to you by your health care provider. Make sure you discuss any questions you have with your health care provider. Document Revised: 10/31/2019 Document Reviewed: 10/31/2019 Elsevier Patient Education  2021 Elsevier Inc.  

## 2020-07-09 NOTE — Progress Notes (Signed)
Virtual Visit via Telephone Note   This visit type was conducted due to national recommendations for restrictions regarding the COVID-19 Pandemic (e.g. social distancing) in an effort to limit this patient's exposure and mitigate transmission in our community.  Due to her co-morbid illnesses, this patient is at least at moderate risk for complications without adequate follow up.  This format is felt to be most appropriate for this patient at this time.  The patient did not have access to video technology/had technical difficulties with video requiring transitioning to audio format only (telephone).  All issues noted in this document were discussed and addressed.  No physical exam could be performed with this format.  Evaluation Performed:  Follow-up visit  Date:  07/09/2020   ID:  Michelle Hunter, Michelle Hunter [birth year], MRN [id number]  Patient Location: Home Provider Location: Office/Clinic  Participants: Patient Location of Patient: Home Location of Provider: Telehealth Consent was obtain for visit to be over via telehealth. I verified that I am speaking with the correct person using two identifiers.  PCP:  Freddy Finner, NP   Chief Complaint:  Vaginal discharge  History of Present Illness:    Michelle Hunter is a 20 y.o. female with PMH of recurrent bacterial vaginosis who has a televisit for c/o thick, clear vaginal discharge for last 3 days. She also has been having vaginal area pain and itching. She was treated for BV with Metronidazole in 03/2020. She states that her partner said that he had been treated for STD, but she is not sure about it. She currently denies fever, chills, pelvic or flank pain. Denies dysuria, hematuria, nausea or vomiting.  The patient does not have symptoms concerning for COVID-19 infection (fever, chills, cough, or new shortness of breath).   Past Medical, Surgical, Social History, Allergies, and Medications have been Reviewed.  Past Medical History:   Diagnosis Date  . Gonorrhea 2015  . Need for immunization against influenza 05/11/2019  . Nexplanon in place   . Urinary tract infection    No past surgical history on file.   Current Meds  Medication Sig  . fluconazole (DIFLUCAN) 150 MG tablet Take 1 tablet (150 mg total) by mouth once for 1 dose.  . metroNIDAZOLE (FLAGYL) 500 MG tablet Take 1 tablet (500 mg total) by mouth 2 (two) times daily.  . Vitamin D, Ergocalciferol, (DRISDOL) 1.25 MG (50000 UT) CAPS capsule Take 1 capsule (50,000 Units total) by mouth every 7 (seven) days.     Allergies:   Patient has no known allergies.   ROS:   Please see the history of present illness.     All other systems reviewed and are negative.   Labs/Other Tests and Data Reviewed:    Recent Labs: No results found for requested labs within last 8760 hours.   Recent Lipid Panel No results found for: CHOL, TRIG, HDL, CHOLHDL, LDLCALC, LDLDIRECT  Wt Readings from Last 3 Encounters:  07/09/20 155 lb (70.3 kg)  05/14/20 162 lb (73.5 kg)  05/07/20 161 lb (73 kg)      ASSESSMENT & PLAN:    Acute vaginitis Considering history of recurrent BV, prescribed Metronidazole Advised for medical evaluation of partner as well Added Fluconazole for possible fungal co-infection Avoid sexual activity while on treatment Safe sex practices  Time:   Today, I have spent 15 minutes reviewing the chart, including problem list, medications, and with the patient with telehealth technology discussing the above problems.   Medication Adjustments/Labs and Tests  Ordered: Current medicines are reviewed at length with the patient today.  Concerns regarding medicines are outlined above.   Tests Ordered: No orders of the defined types were placed in this encounter.   Medication Changes: Meds ordered this encounter  Medications  . metroNIDAZOLE (FLAGYL) 500 MG tablet    Sig: Take 1 tablet (500 mg total) by mouth 2 (two) times daily.    Dispense:  14  tablet    Refill:  0  . fluconazole (DIFLUCAN) 150 MG tablet    Sig: Take 1 tablet (150 mg total) by mouth once for 1 dose.    Dispense:  1 tablet    Refill:  0     Note: This dictation was prepared with Dragon dictation along with smaller phrase technology. Similar sounding words can be transcribed inadequately or may not be corrected upon review. Any transcriptional errors that result from this process are unintentional.      Disposition:  Follow up  Signed, Anabel Halon, MD  07/09/2020 9:46 AM     [facility]

## 2020-07-13 ENCOUNTER — Ambulatory Visit: Payer: Self-pay | Admitting: Nurse Practitioner

## 2020-11-06 ENCOUNTER — Encounter: Payer: Self-pay | Admitting: Internal Medicine

## 2022-01-18 ENCOUNTER — Ambulatory Visit (INDEPENDENT_AMBULATORY_CARE_PROVIDER_SITE_OTHER): Payer: Medicaid Other | Admitting: Adult Health

## 2022-01-18 ENCOUNTER — Other Ambulatory Visit (HOSPITAL_COMMUNITY)
Admission: RE | Admit: 2022-01-18 | Discharge: 2022-01-18 | Disposition: A | Discharge status: Home / Self Care | Payer: Medicaid Other | Source: Ambulatory Visit | Attending: Adult Health | Admitting: Adult Health

## 2022-01-18 ENCOUNTER — Encounter: Payer: Self-pay | Admitting: Adult Health

## 2022-01-18 VITALS — BP 112/71 | HR 68 | Ht 68.0 in | Wt 180.5 lb

## 2022-01-18 DIAGNOSIS — Z3009 Encounter for other general counseling and advice on contraception: Secondary | ICD-10-CM

## 2022-01-18 DIAGNOSIS — Z113 Encounter for screening for infections with a predominantly sexual mode of transmission: Secondary | ICD-10-CM

## 2022-01-18 DIAGNOSIS — Z01419 Encounter for gynecological examination (general) (routine) without abnormal findings: Secondary | ICD-10-CM

## 2022-01-18 NOTE — Progress Notes (Signed)
Patient ID: Michelle Hunter, female   DOB: 2000/08/22, 21 y.o.   MRN: [id number] History of Present Illness: Michelle Hunter is a 21 year old black female,single, G0P0, in for a well woman gyn exam and pap. She has family planning medicaid. PCP is Dr Allena Katz    Current Medications, Allergies, Past Medical History, Past Surgical History, Family History and Social History were reviewed in [employer] electronic medical record.     Review of Systems: Patient denies any headaches, hearing loss, fatigue, blurred vision, shortness of breath, chest pain, abdominal pain, problems with bowel movements, urination, or intercourse. No joint pain or mood swings.     Physical Exam:BP 112/71 (BP Location: Left Arm, Patient Position: Sitting, Cuff Size: Normal)   Pulse 68   Ht 5\' 8"  (1.727 m)   Wt 180 lb 8 oz (81.9 kg)   LMP 01/02/2022   BMI 27.44 kg/m   General:  Well developed, well nourished, no acute distress Skin:  Warm and dry Neck:  Midline trachea, normal thyroid, good ROM, no lymphadenopathy Lungs; Clear to auscultation bilaterally Breast:  No dominant palpable mass, retraction, or nipple discharge,hs bilateral nipple rods Cardiovascular: Regular rate and rhythm Abdomen:  Soft, non tender, no hepatosplenomegaly Pelvic:  External genitalia is normal in appearance, no lesions.  The vagina is normal in appearance. Urethra has no lesions or masses. The cervix is smooth, pap with GC/CHL performed.  Uterus is felt to be normal size, shape, and contour.  No adnexal masses or tenderness noted.Bladder is non tender, no masses felt. Extremities/musculoskeletal:  No swelling or varicosities noted, no clubbing or cyanosis Psych:  No mood changes, alert and cooperative,seems happy AA is 0 Fall risk is low    01/18/2022   10:03 AM 07/09/2020    9:24 AM 05/07/2020   10:52 AM  Depression screen PHQ 2/9  Decreased Interest 0 0 0  Down, Depressed, Hopeless 0 0 0  PHQ - 2 Score 0 0 0  Altered sleeping 0     Tired, decreased energy 0    Change in appetite 0    Feeling bad or failure about yourself  0    Trouble concentrating 0    Moving slowly or fidgety/restless 0    Suicidal thoughts 0    PHQ-9 Score 0         01/18/2022   10:03 AM 11/06/2019    8:14 AM  GAD 7 : Generalized Anxiety Score  Nervous, Anxious, on Edge 0 0  Control/stop worrying 0 0  Worry too much - different things 0 0  Trouble relaxing 0 0  Restless 0 0  Easily annoyed or irritable 0 0  Afraid - awful might happen 0 0  Total GAD 7 Score 0 0  Anxiety Difficulty  Not difficult at all    Upstream - 01/18/22 1002       Pregnancy Intention Screening   Does the patient want to become pregnant in the next year? Ok Either Way    Does the patient's partner want to become pregnant in the next year? Ok Either Way    Would the patient like to discuss contraceptive options today? No      Contraception Wrap Up   Current Method No Method - Other Reason    End Method No Method - Other Reason            Examination chaperoned by 03/20/22 LPN    Impression and Plan: 1. Encounter for gynecological examination with Papanicolaou smear  of cervix Pap sent Pap in 3 years if normal Physical in 1 year - Cytology - PAP( [place])  2. Family planning Pap sent  - Cytology - PAP( [place])  3. Screening examination for STD (sexually transmitted disease) Will check labs - Hepatitis C antibody - Hepatitis B surface antigen - HIV Antibody (routine testing w rflx) - [facility]

## 2022-01-19 LAB — HEPATITIS C ANTIBODY: Hep C Virus Ab: NONREACTIVE

## 2022-01-19 LAB — RPR: RPR Ser Ql: NONREACTIVE

## 2022-01-19 LAB — HEPATITIS B SURFACE ANTIGEN: Hepatitis B Surface Ag: NEGATIVE

## 2022-01-19 LAB — HIV ANTIBODY (ROUTINE TESTING W REFLEX): HIV Screen 4th Generation wRfx: NONREACTIVE

## 2022-01-20 LAB — CYTOLOGY - PAP
Chlamydia: NEGATIVE
Comment: NEGATIVE
Comment: NORMAL
Diagnosis: NEGATIVE
Neisseria Gonorrhea: NEGATIVE

## 2022-08-18 ENCOUNTER — Telehealth: Payer: Medicaid Other | Admitting: Family Medicine

## 2022-08-18 DIAGNOSIS — O2690 Pregnancy related conditions, unspecified, unspecified trimester: Secondary | ICD-10-CM

## 2022-08-19 ENCOUNTER — Telehealth: Payer: Medicaid Other | Admitting: Family Medicine

## 2022-08-19 NOTE — Progress Notes (Signed)
Based on what you shared with me, I feel your condition warrants further evaluation as soon as possible at an Emergency department.    NOTE: There will be NO CHARGE for this eVisit   If you are having a true medical emergency please call 911.      Emergency Department-[facility]  Get Driving Directions  [phone]  [address]  [place], [place] [address]  Open 24/7/365      [facility] [address]  Get Driving Directions  [address]  [place], [place] [address]  Open 24/7/365    Emergency Department- [facility]  Get Driving Directions  [phone]  [address]  [place], [place] [address]  Open 24/7/365      [facility] at [facility]  Get Driving Directions  [phone]  [address]  [place], [place] [address]  Open 24/7/365    [place]  Emergency Department- [facility]  Get Driving Directions  [phone]  [address]  [place], [place] [address]  Open 24/7/365    [place]  Emergency Department- [facility] [place]  Get Driving Directions  [address]  [place], [place] [address]  Open 24/7/365    [place]  Emergency Department- [facility]  Get Driving Directions  [phone]  [address]  [place], [place] [address]  Open 24/7/365    

## 2022-08-19 NOTE — Progress Notes (Signed)
Pt was a no show for visit
